# Patient Record
Sex: Male | Born: 2000 | Race: White | Hispanic: No | Marital: Single | Smoking: Never smoker
Health system: Southern US, Community
[De-identification: ages and names within clinical notes are randomized; demographics above are authoritative.]

## PROBLEM LIST (undated history)

## (undated) HISTORY — PX: OTHER SURGICAL HISTORY: SHX169

## (undated) HISTORY — PX: TONSILLECTOMY: SUR1361

---
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
## 2022-01-20 DIAGNOSIS — J0110 Acute frontal sinusitis, unspecified: Secondary | ICD-10-CM | POA: Diagnosis not present

## 2022-03-08 IMAGING — RF DG UGI W/ HIGH DENSITY W/O KUB
9 of 14 series · 14 of 23 positions shown · non-contrast
Comparison: Today's abdominal ultrasound, dictated separately.

CLINICAL DATA: Abdominal pain and nausea

EXAM:
UPPER GI SERIES WITH KUB
TECHNIQUE: After obtaining a scout radiograph a routine upper GI series was
performed using thin and high density barium.
FLUOROSCOPY TIME:  Fluoroscopy Time:  2 minutes and 24 seconds
Radiation Exposure Index (if provided by the fluoroscopic device):
24.9 mGy
Number of Acquired Spot Images: 0

[Series 1: t abdomen supine · 0.15mm/px · 1 of 1 slices shown]
[im 1/1]
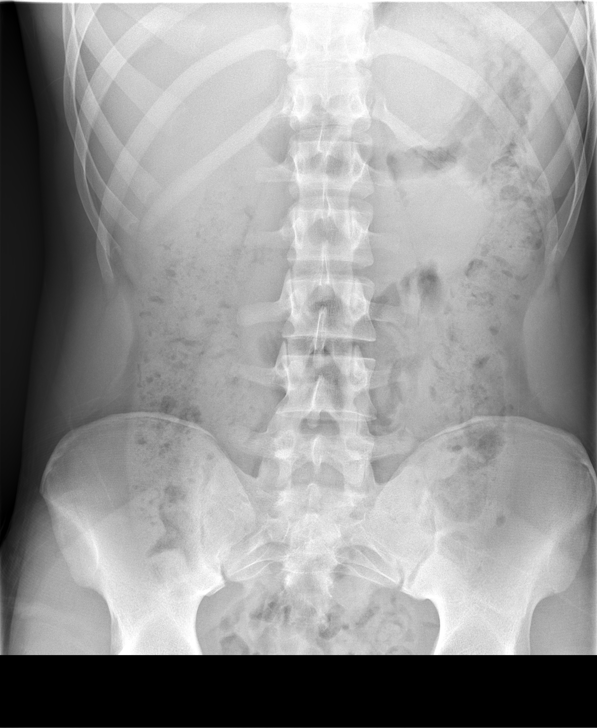

[Series 2: cp_standard · 0.51mm/px · 2 of 322 frames shown (1 of 8)]
[frame 49/322]
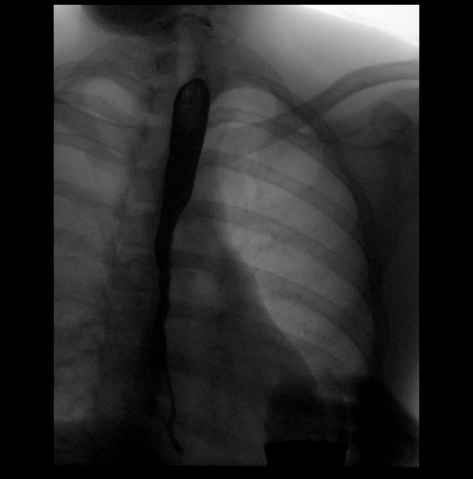
[frame 274/322]
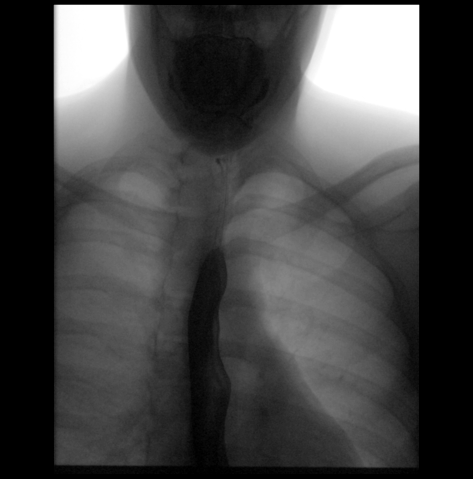

[Series 3: cp_standard · 0.26mm/px · 1 of 1 slices shown (2 of 8)]
[im 1/1]
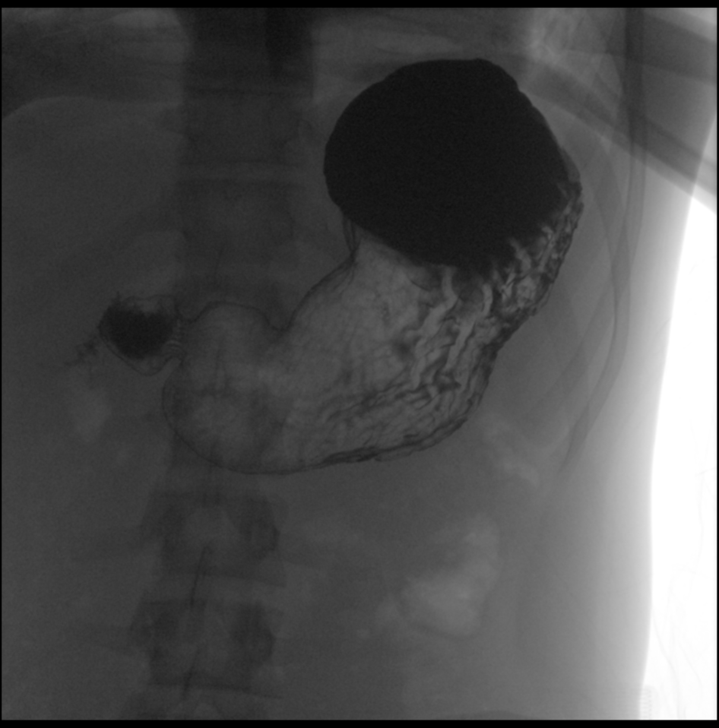

[Series 5: cp_standard · 0.25mm/px · 1 of 1 slices shown (3 of 8)]
[im 1/1]
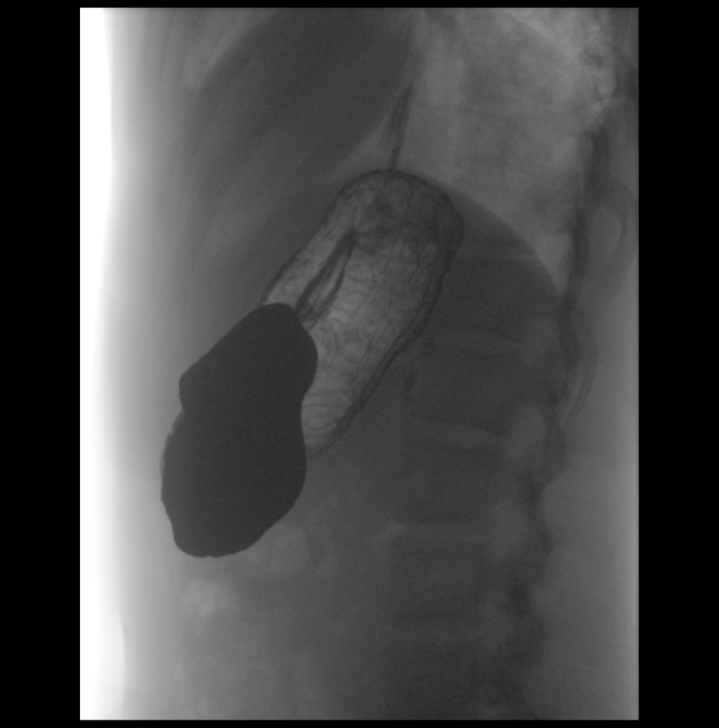

[Series 7: cp_standard · 0.25mm/px · 1 of 1 slices shown (4 of 8)]
[im 1/1]
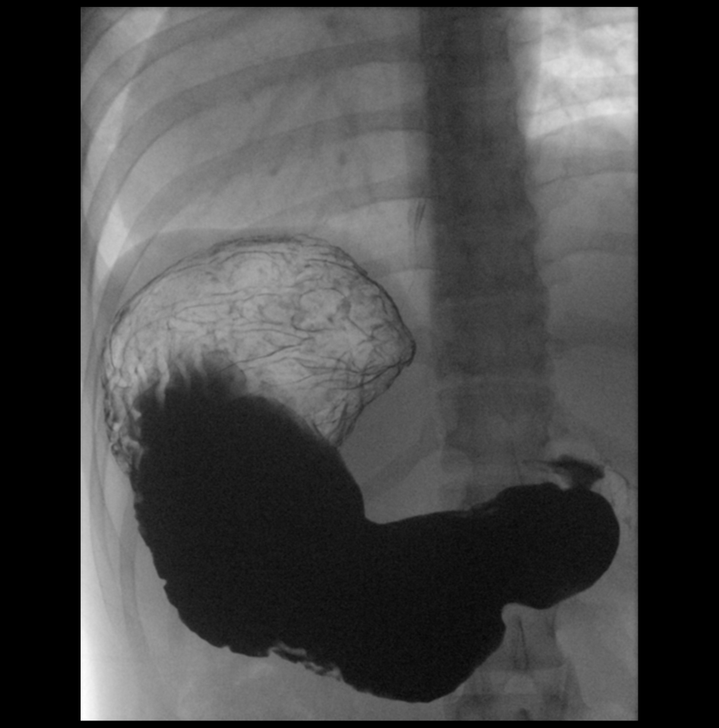

[Series 8: cp_standard · 0.26mm/px · 1 of 1 slices shown (5 of 8)]
[im 1/1]
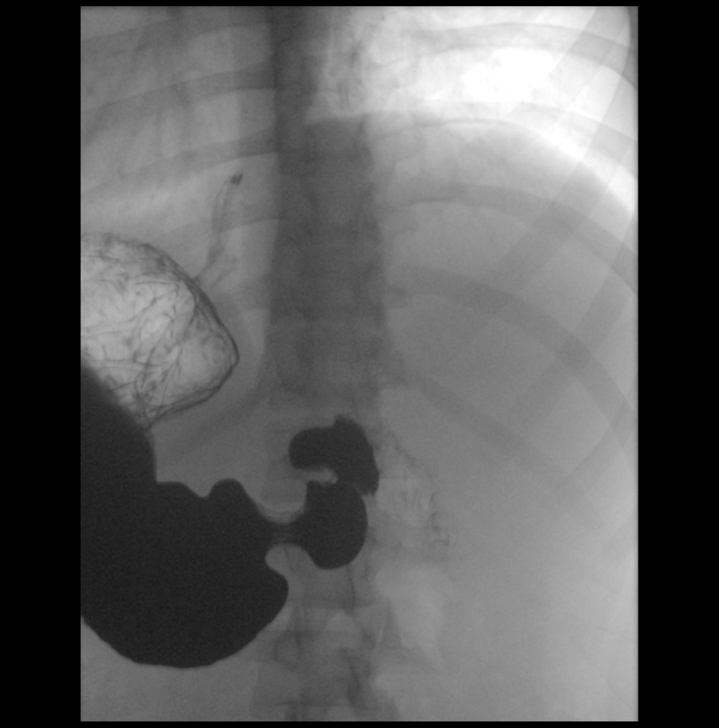

[Series 10: cp_standard · 0.53mm/px · 3 of 132 frames shown (6 of 8)]
[frame 20/132]
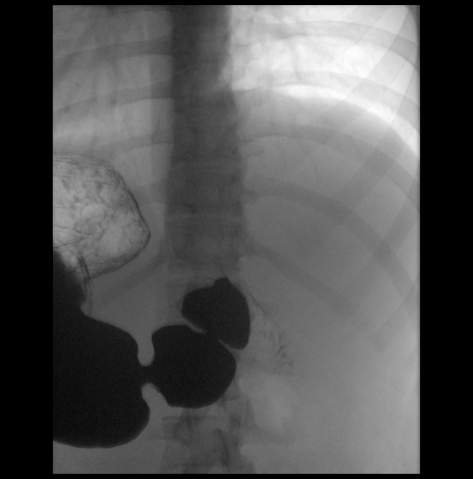
[frame 67/132]
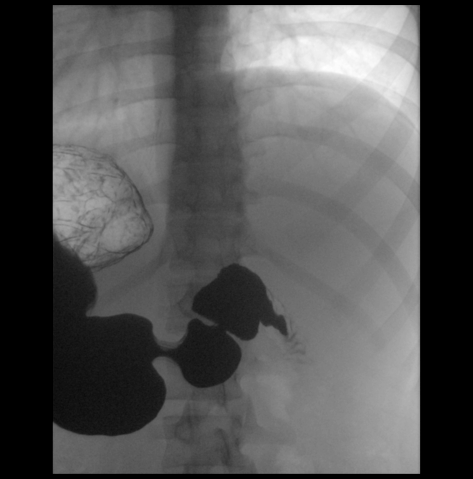
[frame 113/132]
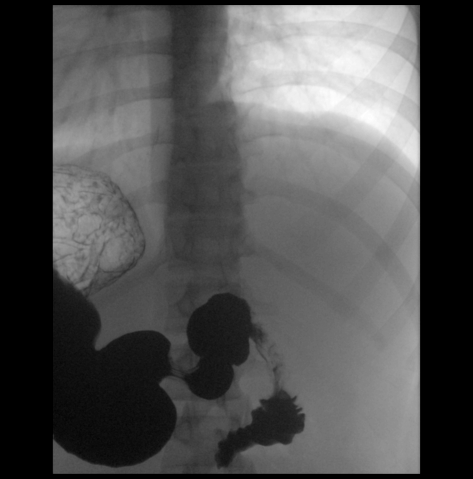

[Series 12: cp_standard · 0.52mm/px · 3 of 75 frames shown (7 of 8)]
[frame 10/75]
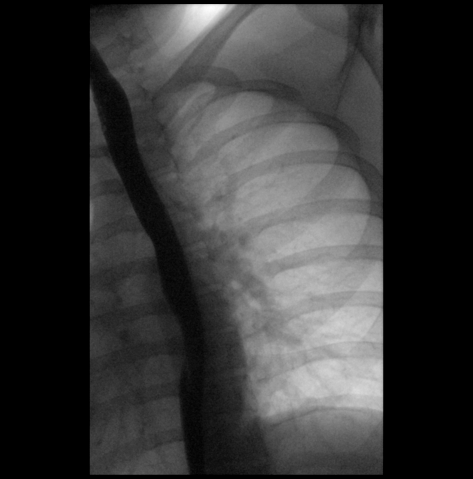
[frame 12/75]
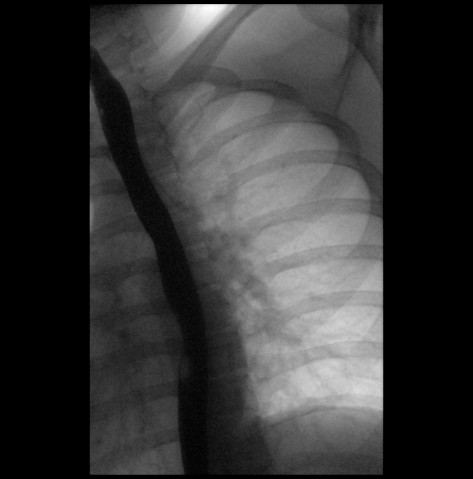
[frame 64/75]
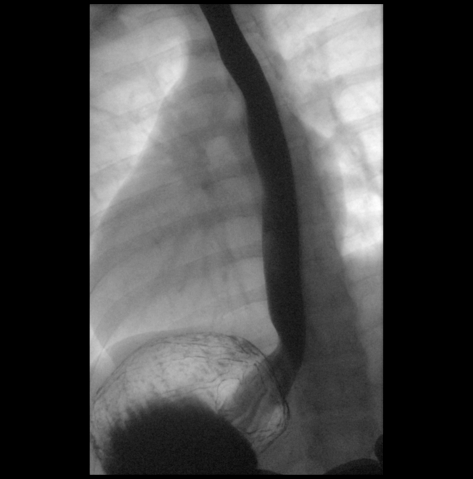

[Series 14: cp_standard · 0.26mm/px · 1 of 1 slices shown (8 of 8)]
[im 1/1]
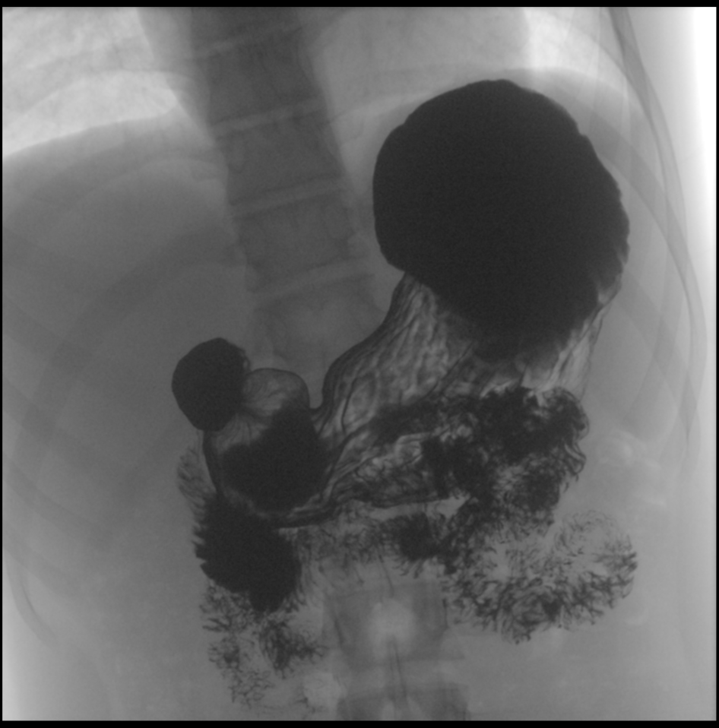

[14 of 23 positions shown; findings below may reference images not displayed]

FINDINGS: Preprocedure scout film demonstrates a nonobstructive bowel-gas
pattern. No abnormal abdominal calcifications. No appendicolith.

Double contrast evaluation of the esophagus demonstrates no mucosal
abnormality.

Double contrast evaluation of the stomach demonstrates no mass,
ulcer, fold abnormality. Prompt passage of contrast into the
duodenal bulb and C-loop. These are normal in appearance

Full column evaluation of the esophagus demonstrates no persistent
narrowing or stricture.
IMPRESSION: Normal upper GI.  No explanation for patient's symptoms.

## 2022-04-19 DIAGNOSIS — J0110 Acute frontal sinusitis, unspecified: Secondary | ICD-10-CM | POA: Diagnosis not present

## 2022-04-25 DIAGNOSIS — R5383 Other fatigue: Secondary | ICD-10-CM | POA: Diagnosis not present

## 2022-04-25 DIAGNOSIS — Z0000 Encounter for general adult medical examination without abnormal findings: Secondary | ICD-10-CM | POA: Diagnosis not present

## 2022-04-25 DIAGNOSIS — Z880 Allergy status to penicillin: Secondary | ICD-10-CM | POA: Diagnosis not present

## 2022-04-25 DIAGNOSIS — L299 Pruritus, unspecified: Secondary | ICD-10-CM | POA: Diagnosis not present

## 2022-06-01 DIAGNOSIS — R69 Illness, unspecified: Secondary | ICD-10-CM | POA: Diagnosis not present

## 2022-06-20 DIAGNOSIS — R69 Illness, unspecified: Secondary | ICD-10-CM | POA: Diagnosis not present

## 2022-06-28 ENCOUNTER — Other Ambulatory Visit: Payer: Self-pay | Admitting: Gastroenterology

## 2022-06-28 DIAGNOSIS — R112 Nausea with vomiting, unspecified: Secondary | ICD-10-CM

## 2022-07-04 DIAGNOSIS — R69 Illness, unspecified: Secondary | ICD-10-CM | POA: Diagnosis not present

## 2022-07-06 ENCOUNTER — Ambulatory Visit
Admission: RE | Admit: 2022-07-06 | Discharge: 2022-07-06 | Disposition: A | Discharge status: Home / Self Care | Payer: 59 | Source: Ambulatory Visit | Attending: Gastroenterology | Admitting: Gastroenterology

## 2022-07-06 DIAGNOSIS — R112 Nausea with vomiting, unspecified: Secondary | ICD-10-CM

## 2022-07-06 IMAGING — DX DG CHEST 2V
2 series · 2 of 2 positions shown · non-contrast
Comparison: None.

CLINICAL DATA: Cough.

EXAM:
CHEST - 2 VIEW

[chest pa]
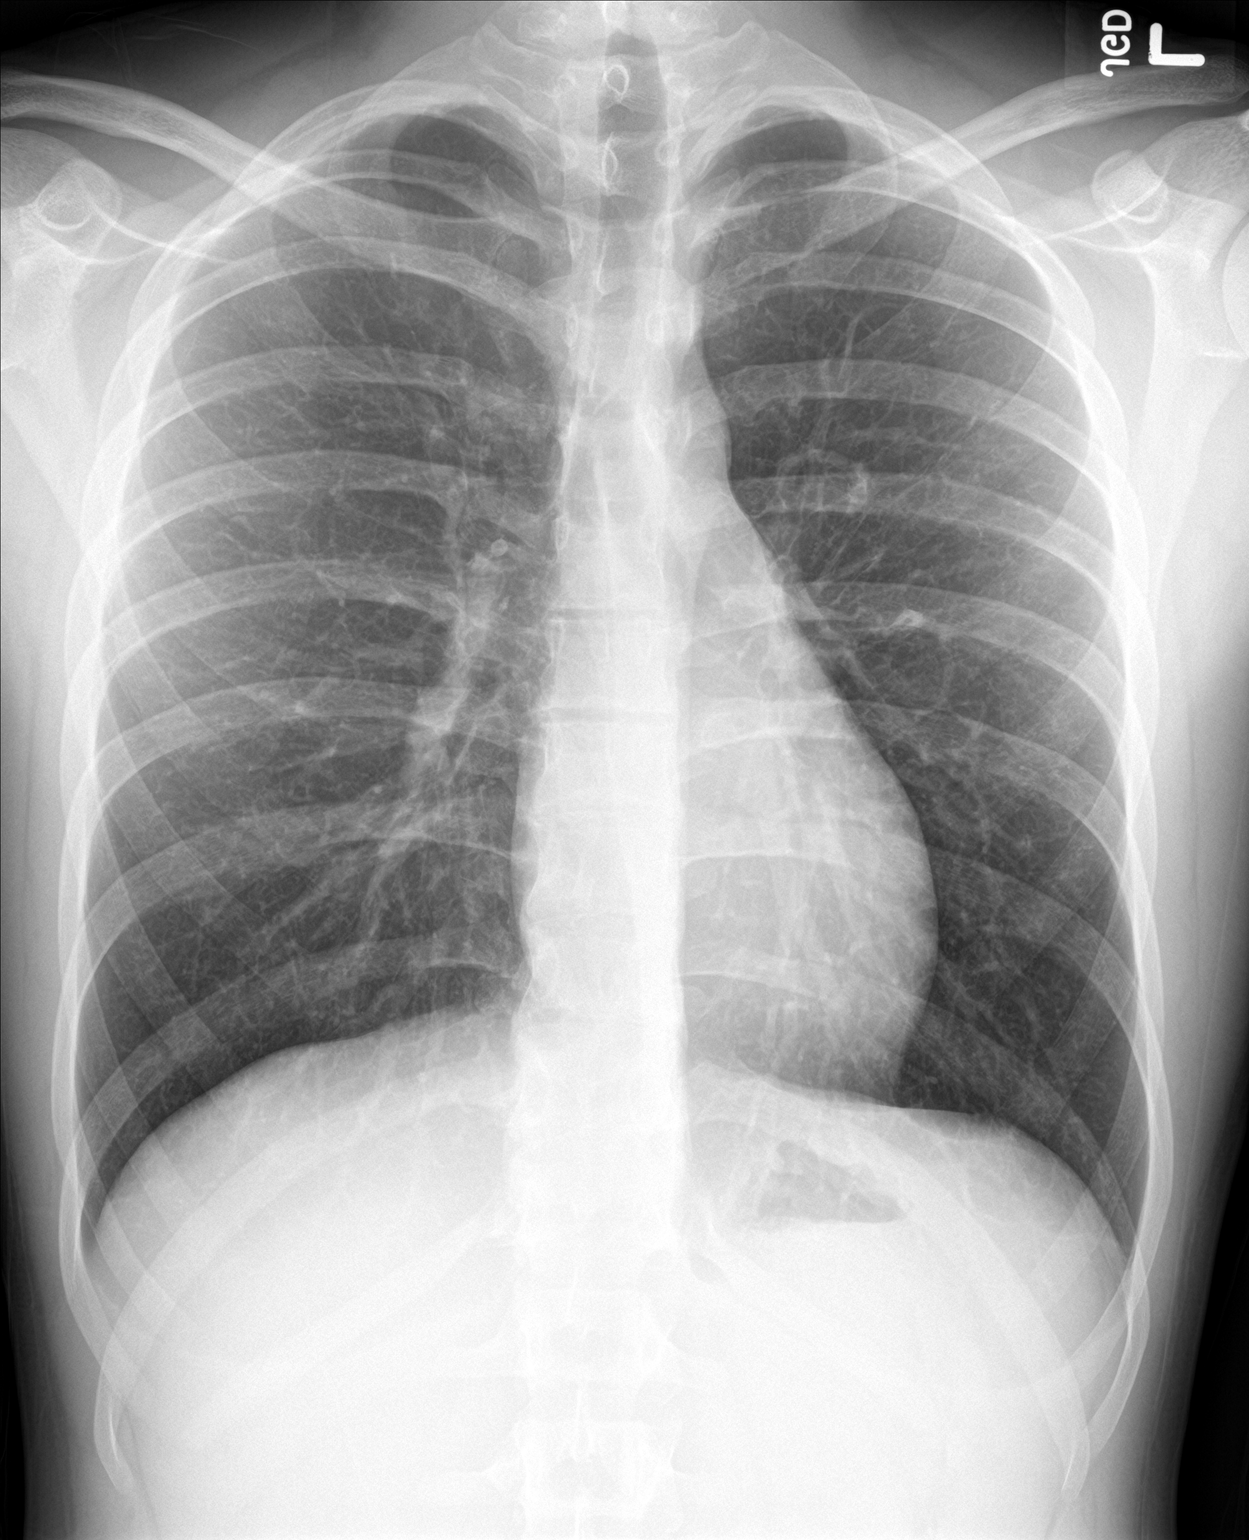

[chest lat]
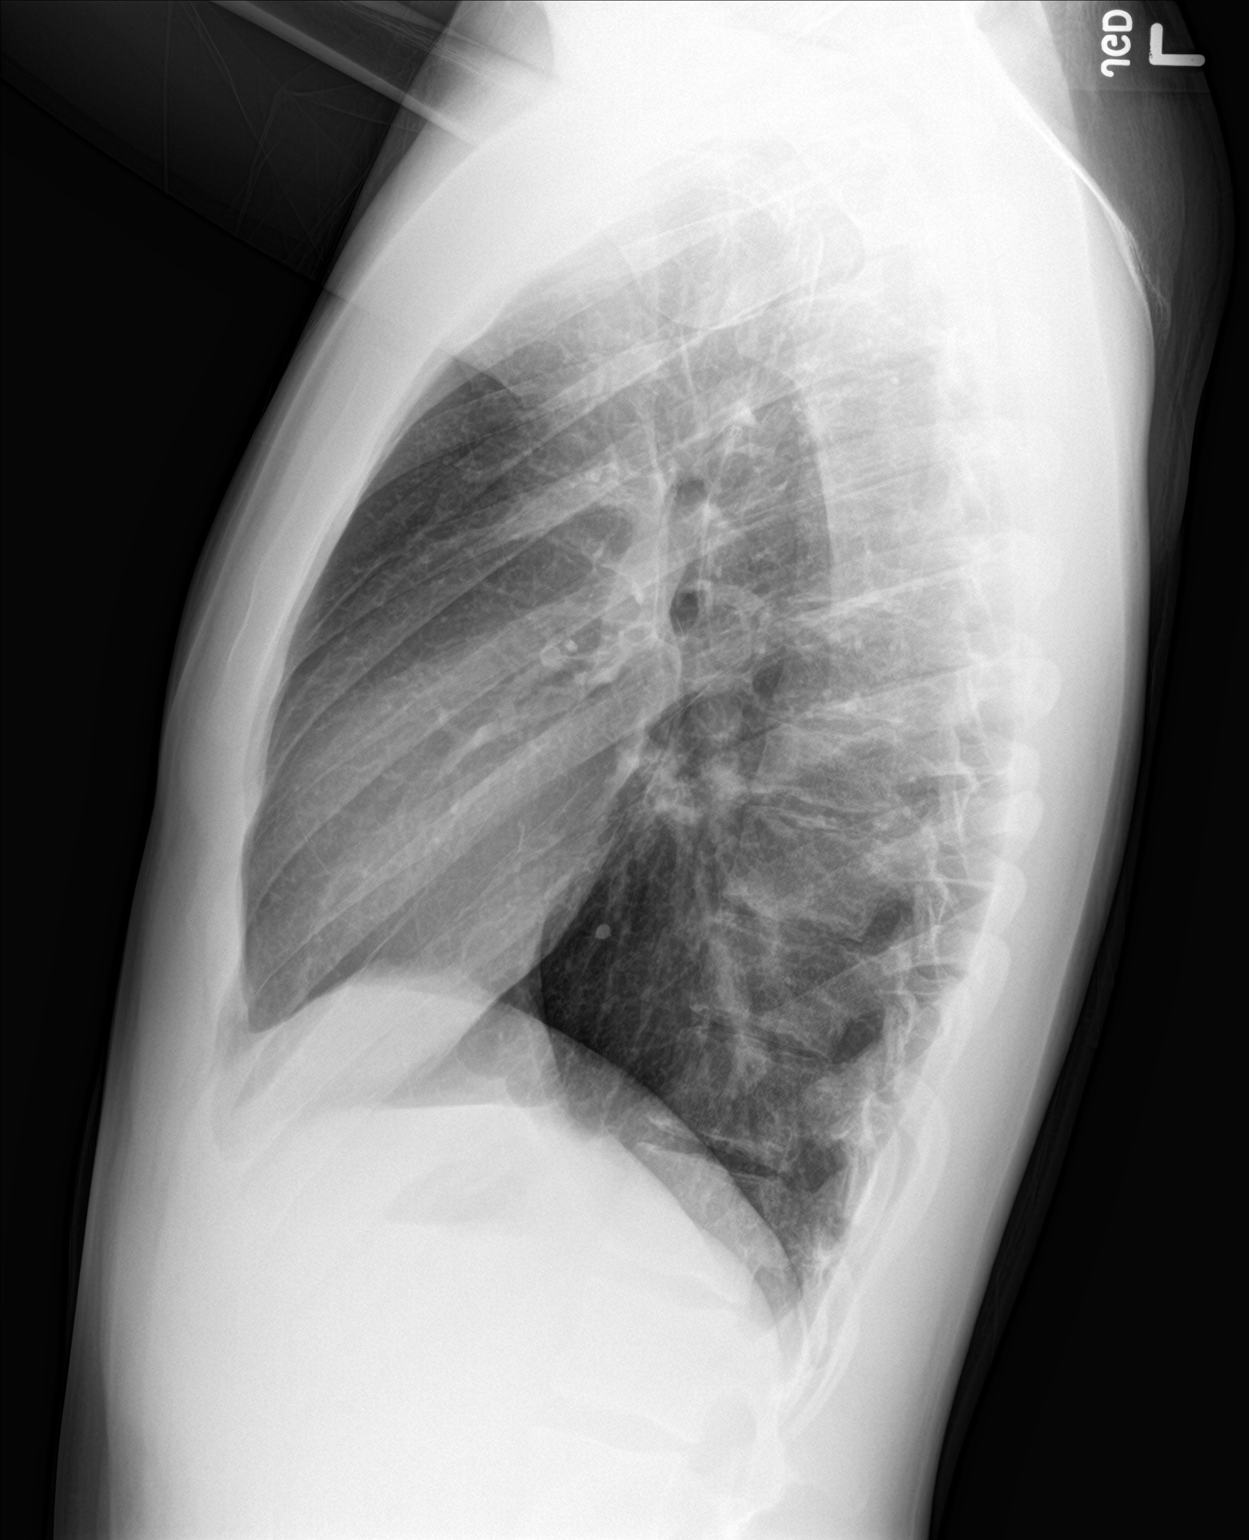

[2 of 2 positions shown; findings below may reference images not displayed]

FINDINGS: The heart size and mediastinal contours are within normal limits.
Both lungs are clear. The visualized skeletal structures are
unremarkable.
IMPRESSION: No active cardiopulmonary disease.

## 2022-07-11 DIAGNOSIS — R69 Illness, unspecified: Secondary | ICD-10-CM | POA: Diagnosis not present

## 2022-07-21 DIAGNOSIS — R69 Illness, unspecified: Secondary | ICD-10-CM | POA: Diagnosis not present

## 2022-08-18 DIAGNOSIS — R69 Illness, unspecified: Secondary | ICD-10-CM | POA: Diagnosis not present

## 2022-09-06 DIAGNOSIS — R69 Illness, unspecified: Secondary | ICD-10-CM | POA: Diagnosis not present

## 2022-09-19 DIAGNOSIS — R0981 Nasal congestion: Secondary | ICD-10-CM | POA: Diagnosis not present

## 2022-09-19 DIAGNOSIS — B353 Tinea pedis: Secondary | ICD-10-CM | POA: Diagnosis not present

## 2022-09-26 DIAGNOSIS — R69 Illness, unspecified: Secondary | ICD-10-CM | POA: Diagnosis not present

## 2022-10-24 DIAGNOSIS — R69 Illness, unspecified: Secondary | ICD-10-CM | POA: Diagnosis not present

## 2022-10-30 DIAGNOSIS — R69 Illness, unspecified: Secondary | ICD-10-CM | POA: Diagnosis not present

## 2022-11-27 ENCOUNTER — Telehealth: Payer: Self-pay | Admitting: Internal Medicine

## 2022-11-27 DIAGNOSIS — R69 Illness, unspecified: Secondary | ICD-10-CM | POA: Diagnosis not present

## 2022-11-27 NOTE — Telephone Encounter (Signed)
Good Morning Dr Leonides Schanz  Patient is requesting transfer of care to Dr Marina Goodell. Per moms request. Please advise.  Thank you

## 2022-12-01 NOTE — Telephone Encounter (Signed)
I am unable to accommodate. Sorry. The patient is in excellent hands with Dr. Leonides Schanz. Thanks

## 2022-12-12 NOTE — Telephone Encounter (Signed)
Patient's mother called asking for a second opinion and requested to see Dr. Carlean Purl. Would you approve request?

## 2022-12-18 NOTE — Telephone Encounter (Signed)
I am not able to fulfill this request, either.

## 2022-12-19 DIAGNOSIS — R69 Illness, unspecified: Secondary | ICD-10-CM | POA: Diagnosis not present

## 2022-12-20 NOTE — Telephone Encounter (Signed)
Spoke with patients mother and advised her of information below. She understood but said she will be calling back to ask another provider.

## 2023-01-23 ENCOUNTER — Ambulatory Visit: Payer: Self-pay | Admitting: Internal Medicine

## 2023-01-31 ENCOUNTER — Encounter: Payer: Self-pay | Admitting: Internal Medicine

## 2023-02-02 ENCOUNTER — Ambulatory Visit: Payer: Self-pay | Admitting: Internal Medicine

## 2023-02-28 ENCOUNTER — Encounter: Payer: Self-pay | Admitting: Internal Medicine

## 2023-02-28 ENCOUNTER — Ambulatory Visit: Payer: Self-pay | Admitting: Internal Medicine

## 2023-02-28 VITALS — BP 110/58 | HR 56 | Ht 72.0 in | Wt 170.0 lb

## 2023-02-28 DIAGNOSIS — R1033 Periumbilical pain: Secondary | ICD-10-CM

## 2023-02-28 DIAGNOSIS — R5383 Other fatigue: Secondary | ICD-10-CM

## 2023-02-28 DIAGNOSIS — R112 Nausea with vomiting, unspecified: Secondary | ICD-10-CM

## 2023-02-28 DIAGNOSIS — R634 Abnormal weight loss: Secondary | ICD-10-CM

## 2023-02-28 MED ORDER — DICYCLOMINE HCL 10 MG PO CAPS: 10.0000 mg | ORAL_CAPSULE | Freq: Three times a day (TID) | ORAL | 0 refills | Status: DC | PRN

## 2023-02-28 NOTE — Progress Notes (Signed)
Chief Complaint: Abdominal pain, N&V  HPI : 22 year old male without significant medical history presents for follow up of abdominal pain and N&V  Interval History: His N&V and ab pain have persisted for years.  Every time he is sick, he has a stomach ache. The pain is around the belly button. When he wakes up, he will have nausea. Rarely he will throw up.  Eating a lot carbs and fried foods tonight before hand seems to make his symptoms worse. Denies chest burning or regurgitation. Endorses diarrhea twice a month. Weight has been going down unintentionally. He has used marijuana and Delta-8 1-2 months ago but not recently. Denies alcohol use. He does feel like he is more predisposed to getting infections. Pepto Bismol helps with his N&V and abdominal pain. 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 Since her last clinic visit, patient was seen by a naturopathic provider who performed barium integrity testing that suggested a high zonulin level, which he was told is concerning for celiac disease.  He tried the omeprazole medication that I prescribed to him previously, but he only took this medication sporadically so he states it is not clear whether or not this medication helped him.  Wt Readings from Last 3 Encounters:  02/28/23 170 lb (77.1 kg) (72 %, Z= 0.58)*  10/22/21 159 lb 6.4 oz (72.3 kg) (66 %, Z= 0.41)*  09/14/21 171 lb 4 oz (77.7 kg) (79 %, Z= 0.82)*   * Growth percentiles are based on CDC (Boys, 2-20 Years) data.    Current Outpatient Medications  Medication Sig Dispense Refill   bismuth subsalicylate (PEPTO BISMOL) 262 MG chewable tablet Chew 524 mg by mouth as needed.     dicyclomine (BENTYL) 10 MG capsule Take 1 capsule (10 mg total) by mouth 3 (three) times daily as needed for spasms. 90 capsule 0   omeprazole (PRILOSEC) 40 MG capsule Take 1 capsule (40 mg total) by mouth daily. Take one capsule 30 minutes before dinner daily. (Patient not taking: Reported on  02/28/2023) 30 capsule 1   ondansetron (ZOFRAN) 4 MG tablet Take 1 tablet (4 mg total) by mouth 3 (three) times daily as needed for nausea or vomiting. (Patient not taking: Reported on 02/28/2023) 30 tablet 1   ondansetron (ZOFRAN-ODT) 8 MG disintegrating tablet Take 1 tablet (8 mg total) by mouth every 8 (eight) hours as needed for nausea or vomiting. (Patient not taking: Reported on 02/28/2023) 20 tablet 0   No current facility-administered medications for this visit.   Physical Exam: BP (!) 110/58 (BP Location: Left Arm, Patient Position: Sitting, Cuff Size: Normal)   Pulse (!) 56   Ht 6' (1.829 m)   Wt 170 lb (77.1 kg)   BMI 23.06 kg/m  Constitutional: Pleasant,well-developed, male in no acute distress. HEENT: Normocephalic and atraumatic. Conjunctivae are normal. No scleral icterus. Cardiovascular: Normal rate, regular rhythm.  Pulmonary/chest: Effort normal and breath sounds normal. No wheezing, rales or rhonchi. Abdominal: Soft, nondistended, mildly tender to palpation in the epigastric area and in the lower abdomen. Bowel sounds active throughout. There are no masses palpable. No hepatomegaly. Extremities: No edema Neurological: Alert and oriented to person place and time. Skin: Skin is warm and dry. No rashes noted. Psychiatric: Normal mood and affect. Behavior is normal.  Labs 09/2021: CBC and CMP unremarkable. Lipase mildly elevated at 81. TSH nml. TTG IgA negative.  Cortisol nml.   CT A/P 03/31/12: IMPRESSION:  Negative for appendicitis.  Findings consistent with mesenteric  adenitis.   Upper GI series 06/24/21: IMPRESSION: Normal upper GI.  No explanation for patient's symptoms.  Ab U/S 06/24/21: IMPRESSION: Normal abdominal ultrasound for age.  Ab U/S 07/06/22: IMPRESSION: 1. No abnormalities are identified to explain the patient's nausea and vomiting. Normal study within visualized limits.  ASSESSMENT AND PLAN: Periumbilical abdominal pain N&V Fatigue Weight  loss Patient continues to have issues with nausea, vomiting, and abdominal pain.  Most of the symptoms primarily occur in the mornings, which may suggest a component of GERD.  Patient does have a sister with history of celiac disease so we will plan for an EGD for further evaluation.  Patient also describes some issues with fatigue, chronic recurrent infections, and weight loss so we will plan for a lab evaluation of vitamin deficiencies and look for signs of low immunoglobulin levels.  Patient reports previously noted to have a mildly elevated lipase levels, which were not high enough to qualify for a diagnosis of pancreatitis.  Will look for signs of underlying pancreas inflammation by performing a CT scan and rechecking his lipase level today.  In the meantime we will have him follow a low FODMAP diet and use Bentyl as needed for abdominal pain issues. - Low FODMAP diet - Check vitamin D, vitamin B12, folate, ferritin/IBC, CRP, IgG, lipase - Bentyl 10 mg TID PRN for ab pain - Avoid marijuana and delta 8 use, which can cause worsened nausea, vomiting and abdominal pain - CT A/P w/contrast - EGD [facility] - Consider GES in the future  Christia Reading, MD  I spent 41 minutes of time, including in depth chart review, independent review of results as outlined above, communicating results with the patient directly, face-to-face time with the patient, coordinating care, ordering studies and medications as appropriate, and documentation.

## 2023-02-28 NOTE — Patient Instructions (Addendum)
You have been scheduled for a CT scan of the abdomen and pelvis at [facility], 1st floor Radiology. You are scheduled on 03/09/23 at 3:30 pm. You should arrive 15 minutes prior to your appointment time for registration.   You may take any medications as prescribed with a small amount of water, if necessary. If you take any of the following medications: METFORMIN, GLUCOPHAGE, GLUCOVANCE, AVANDAMET, RIOMET, FORTAMET, [place] MET, JANUMET, GLUMETZA or METAGLIP, you MAY be asked to HOLD this medication 48 hours AFTER the exam.   The purpose of you drinking the oral contrast is to aid in the visualization of your intestinal tract. The contrast solution may cause some diarrhea. Depending on your individual set of symptoms, you may also receive an intravenous injection of x-ray contrast/dye. Plan on being at [facility] for 45 minutes or longer, depending on the type of exam you are having performed.   If you have any questions regarding your exam or if you need to reschedule, you may call [facility] at [phone] between the hours of 8:00 am and 5:00 pm, Monday-Friday.   You have been scheduled for an endoscopy. Please follow written instructions given to you at your visit today. If you use inhalers (even only as needed), please bring them with you on the day of your procedure.   We have sent the following medications to your pharmacy for you to pick up at your convenience: Bentyl  _______________________________________________________  If your blood pressure at your visit was 140/90 or greater, please contact your primary care physician to follow up on this.  _______________________________________________________  If you are age 43 or older, your body mass index should be between 23-30. Your Body mass index is 23.06 kg/m. If this is out of the aforementioned range listed, please consider follow up with your Primary Care Provider.  If you are age 80 or younger, your body mass  index should be between 19-25. Your Body mass index is 23.06 kg/m. If this is out of the aformentioned range listed, please consider follow up with your Primary Care Provider.   ________________________________________________________  The [facility] providers would like to encourage you to use [facility] to communicate with providers for non-urgent requests or questions.  Due to long hold times on the telephone, sending your provider a message by [facility] may be a faster and more efficient way to get a response.  Please allow 48 business hours for a response.  Please remember that this is for non-urgent requests.  _______________________________________________________   Due to recent changes in healthcare laws, you may see the results of your imaging and laboratory studies on MyChart before your provider has had a chance to review them.  We understand that in some cases there may be results that are confusing or concerning to you. Not all laboratory results come back in the same time frame and the provider may be waiting for multiple results in order to interpret others.  Please give [place] 48 hours in order for your provider to thoroughly review all the results before contacting the office for clarification of your results.    Thank you for entrusting me with your care and for choosing [facility], Dr. Christia Reading

## 2023-03-08 DIAGNOSIS — F411 Generalized anxiety disorder: Secondary | ICD-10-CM | POA: Diagnosis not present

## 2023-03-09 ENCOUNTER — Ambulatory Visit (HOSPITAL_COMMUNITY): Payer: Self-pay

## 2023-03-14 DIAGNOSIS — F411 Generalized anxiety disorder: Secondary | ICD-10-CM | POA: Diagnosis not present

## 2023-03-15 ENCOUNTER — Telehealth: Payer: Self-pay

## 2023-03-15 NOTE — Telephone Encounter (Signed)
Spoke with the patient's mother. [employer] insurance denied the CT A/P to be done in a hospital setting. The imaging must be done in an outpatient setting. Advised she will receive a call from [facility] to schedule the same imaging that was ordered, but in a different location. Expresses understanding. Asked if she had questions. No questions.

## 2023-03-23 ENCOUNTER — Ambulatory Visit (HOSPITAL_COMMUNITY): Payer: 59

## 2023-03-29 ENCOUNTER — Ambulatory Visit: Payer: 59 | Admitting: Internal Medicine

## 2023-03-29 DIAGNOSIS — F411 Generalized anxiety disorder: Secondary | ICD-10-CM | POA: Diagnosis not present

## 2023-04-20 ENCOUNTER — Other Ambulatory Visit: Payer: 59

## 2023-04-24 ENCOUNTER — Encounter: Payer: Self-pay | Admitting: Internal Medicine

## 2023-04-26 DIAGNOSIS — Z0000 Encounter for general adult medical examination without abnormal findings: Secondary | ICD-10-CM | POA: Diagnosis not present

## 2023-04-27 ENCOUNTER — Encounter: Payer: Self-pay | Admitting: Internal Medicine

## 2023-04-27 ENCOUNTER — Ambulatory Visit: Payer: Self-pay | Admitting: Internal Medicine

## 2023-04-27 ENCOUNTER — Telehealth: Payer: Self-pay | Admitting: Internal Medicine

## 2023-04-27 VITALS — BP 141/86 | HR 54 | Temp 98.1°F | Ht 72.0 in | Wt 170.0 lb

## 2023-04-27 MED ORDER — SODIUM CHLORIDE 0.9 % IV SOLN: 500.0000 mL | Freq: Once | INTRAVENOUS | Status: DC

## 2023-04-27 NOTE — Telephone Encounter (Signed)
After talking with Kenneth Mainland, CRNA she said it would be fine to continue with his procedure today. Given that she will not be giving gases and just propofol it should be a problem at all.

## 2023-04-27 NOTE — Progress Notes (Signed)
Pt drank at 3pm. Would not be able to start procedure until 5pm. Procedure rescheduled for Monday 6/3/224 at 10am.

## 2023-04-27 NOTE — Telephone Encounter (Signed)
Patients father called stating that his uncle has malignant hypothermia. He is concerned for the anesthesia for patient's endoscopy for today and wanted to make sure that this was on his chart. Please advise, thank you.

## 2023-05-01 DIAGNOSIS — R29898 Other symptoms and signs involving the musculoskeletal system: Secondary | ICD-10-CM | POA: Diagnosis not present

## 2023-05-01 DIAGNOSIS — F439 Reaction to severe stress, unspecified: Secondary | ICD-10-CM | POA: Diagnosis not present

## 2023-05-01 DIAGNOSIS — Z0000 Encounter for general adult medical examination without abnormal findings: Secondary | ICD-10-CM | POA: Diagnosis not present

## 2023-05-08 ENCOUNTER — Ambulatory Visit: Payer: 59 | Admitting: Family Medicine

## 2023-05-08 ENCOUNTER — Ambulatory Visit (INDEPENDENT_AMBULATORY_CARE_PROVIDER_SITE_OTHER): Payer: 59

## 2023-05-08 ENCOUNTER — Encounter: Payer: Self-pay | Admitting: Family Medicine

## 2023-05-08 ENCOUNTER — Other Ambulatory Visit: Payer: Self-pay

## 2023-05-08 VITALS — BP 102/72 | HR 50 | Ht 73.0 in | Wt 173.6 lb

## 2023-05-08 DIAGNOSIS — G8929 Other chronic pain: Secondary | ICD-10-CM

## 2023-05-08 DIAGNOSIS — M25562 Pain in left knee: Secondary | ICD-10-CM | POA: Diagnosis not present

## 2023-05-08 DIAGNOSIS — M25561 Pain in right knee: Secondary | ICD-10-CM

## 2023-05-08 DIAGNOSIS — F439 Reaction to severe stress, unspecified: Secondary | ICD-10-CM | POA: Insufficient documentation

## 2023-05-08 MED ORDER — NITROGLYCERIN 0.2 MG/HR TD PT24: MEDICATED_PATCH | TRANSDERMAL | 1 refills | Status: DC

## 2023-05-08 NOTE — Progress Notes (Signed)
I, [name], CMA acting as a scribe for [name], MD.  Kenneth Wilkinson is a 22 y.o. male who presents to [facility] today for bilat knee pain, R>L. Pain x 2.5 years, denies past or current injury. Sx present distal to patella. Sx come and go, worse while running or exercising. Mechanical sx present. Denies swelling. Occasional numbness at the knees. Has tried stretching, feels general tightness in the legs, especially the hamstring. No other treatment modalities. No recent imaging.   He did see either an Event organiser or physician or provider in high school for similar pain and was recommended to use patellar straps.  This helped a little.  Pertinent review of systems: No fevers or chills  Relevant historical information: Otherwise healthy.  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   Exam:  BP 102/72   Pulse (!) 50   Ht 6\' 1"  (1.854 m)   Wt 173 lb 9.6 oz (78.7 kg)   SpO2 97%   BMI 22.90 kg/m  General: Well Developed, well nourished, and in no acute distress.   MSK: Knees bilaterally normal appearing. Normal motion. Tender palpation proximal patellar tendons bilateral knees. Stable ligamentous exam. Intact strength.    Lab and Radiology Results  Diagnostic Limited MSK Ultrasound of: Bilateral knees Right knee: Intact normal-appearing quad tendon. Patellar tendon is thick near origin inferior patellar pole without tendon disruption.  Mild increased vascular activity in the peritendinous region consistent with peritendinitis. Normal medial lateral joint line. No Baker's cyst. Left knee: Intact normal-appearing quad tendon. Patellar tendon is also thick near origin inferior patellar pole.  There is a small hypoechoic change of the deep proximal patellar tendon. Increased vascular activity. Otherwise patellar tendon is normal-appearing Medial and lateral joint line are normal-appearing. No Baker's  cyst. Impression: Bilateral proximal patellar tendinitis left worse than right.  X-ray images bilateral knees obtained today personally interpreted.  Right knee: Normal-appearing knee.  No significant DJD or calcific tendinitis.  No acute fractures are visible.  Left knee: No significant bony abnormality.  No DJD.  No fracture.  No calcific tendinitis visible.  Await formal radiology review   Assessment and Plan: 22 y.o. male with bilateral anterior knee pain.  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  He is a good candidate for trial of PT.  Plan to refer to PT and use nitroglycerin patch protocol.  Recheck in 6 weeks.   PDMP not reviewed this encounter. Orders Placed This Encounter  Procedures   [place] LIMITED JOINT SPACE STRUCTURES LOW BILAT(NO LINKED CHARGES)    Order Specific Question:   Reason for Exam (SYMPTOM  OR DIAGNOSIS REQUIRED)    Answer:   bilat knee pain, distal to patella    Order Specific Question:   Preferred imaging location?    Answer:   [facility] Knee AP/LAT W/Sunrise Right    Standing Status:   Future    Number of Occurrences:   1    Standing Expiration Date:   06/08/2023    Order Specific Question:   Reason for Exam (SYMPTOM  OR DIAGNOSIS REQUIRED)    Answer:   bilat knee pain, distal to patella    Order Specific Question:   Preferred imaging location?    Answer:   [name] Knee AP/LAT W/Sunrise Left  Standing Status:   Future    Number of Occurrences:   1    Standing Expiration Date:   06/08/2023    Order Specific Question:   Reason for Exam (SYMPTOM  OR DIAGNOSIS REQUIRED)    Answer:   bilat knee pain, distal to patella    Order Specific Question:   Preferred imaging location?    Answer:   [name]   Ambulatory referral to Physical Therapy    Referral Priority:   Routine    Referral  Type:   Physical Medicine    Referral Reason:   Specialty Services Required    Requested Specialty:   Physical Therapy    Number of Visits Requested:   1   Meds ordered this encounter  Medications   nitroGLYCERIN (NITRODUR - DOSED IN MG/24 HR) 0.2 mg/hr patch    Sig: Apply 1/4 patch daily to tendon for tendonitis.    Dispense:  30 patch    Refill:  1     Discussed warning signs or symptoms. Please see discharge instructions. Patient expresses understanding.   The above documentation has been reviewed and is accurate and complete [name], M.D.

## 2023-05-08 NOTE — Progress Notes (Signed)
Left knee x-ray looks normal to radiology

## 2023-05-08 NOTE — Patient Instructions (Signed)
Thank you for coming in today.   Please get an Xray today before you leave   I've referred you to Physical Therapy.  Let us know if you don't hear from them in one week.   Nitroglycerin Protocol  Apply 1/4 nitroglycerin patch to affected area daily. Change position of patch within the affected area every 24 hours. You may experience a headache during the first 1-2 weeks of using the patch, these should subside. If you experience headaches after beginning nitroglycerin patch treatment, you may take your preferred over the counter pain reliever. Another side effect of the nitroglycerin patch is skin irritation or rash related to patch adhesive. Please notify our office if you develop more severe headaches or rash, and stop the patch. Tendon healing with nitroglycerin patch may require 12 to 24 weeks depending on the extent of injury. Men should not use if taking Viagra, Cialis, or Levitra.  Do not use if you have migraines or rosacea.     Recheck in 6 weeks.   Let me know if this is not working

## 2023-05-08 NOTE — Progress Notes (Signed)
Right knee x-ray looks normal to radiology

## 2023-05-14 ENCOUNTER — Encounter: Payer: Self-pay | Admitting: Internal Medicine

## 2023-05-14 ENCOUNTER — Ambulatory Visit (AMBULATORY_SURGERY_CENTER): Payer: 59 | Admitting: Internal Medicine

## 2023-05-14 VITALS — BP 105/46 | HR 63 | Temp 99.1°F | Resp 11 | Ht 72.0 in | Wt 170.0 lb

## 2023-05-14 DIAGNOSIS — K2980 Duodenitis without bleeding: Secondary | ICD-10-CM | POA: Diagnosis not present

## 2023-05-14 DIAGNOSIS — K2990 Gastroduodenitis, unspecified, without bleeding: Secondary | ICD-10-CM | POA: Diagnosis not present

## 2023-05-14 DIAGNOSIS — K2970 Gastritis, unspecified, without bleeding: Secondary | ICD-10-CM | POA: Diagnosis not present

## 2023-05-14 DIAGNOSIS — R112 Nausea with vomiting, unspecified: Secondary | ICD-10-CM | POA: Diagnosis not present

## 2023-05-14 DIAGNOSIS — K319 Disease of stomach and duodenum, unspecified: Secondary | ICD-10-CM | POA: Diagnosis not present

## 2023-05-14 MED ORDER — OMEPRAZOLE 40 MG PO CPDR
40.0000 mg | DELAYED_RELEASE_CAPSULE | Freq: Two times a day (BID) | ORAL | 1 refills | Status: DC
Start: 2023-05-14 — End: 2023-06-18

## 2023-05-14 MED ORDER — SODIUM CHLORIDE 0.9 % IV SOLN
500.0000 mL | Freq: Once | INTRAVENOUS | Status: DC
Start: 2023-05-14 — End: 2023-05-14

## 2023-05-14 NOTE — Op Note (Signed)
[facility] Patient Name: Kenneth Wilkinson Procedure Date: 05/14/2023 10:20 AM MRN: [id number] Endoscopist: Particia Lather , , [id number] Age: 22 Referring MD:  Date of Birth: [birth year] Gender: Male Account #: [id number] Procedure:                Upper GI endoscopy Indications:              Periumbilical abdominal pain, Nausea with vomiting,                            Weight loss Medicines:                Monitored Anesthesia Care Procedure:                After obtaining informed consent, the endoscope was                            passed under direct vision. Throughout the                            procedure, the patient's blood pressure, pulse, and                            oxygen saturations were monitored continuously. The                            Olympus Scope [id number] was introduced through the                            mouth, and advanced to the second part of duodenum.                            The upper GI endoscopy was accomplished without                            difficulty. The patient tolerated the procedure                            well. Scope In: Scope Out: Findings:                 The examined esophagus was normal. Biopsies were                            taken with a cold forceps for histology.                           Clear fluid was found in the gastric body.                           Localized mild inflammation characterized by                            congestion (edema), erosions and erythema was found                            in the  gastric antrum. Biopsies were taken with a                            cold forceps for histology.                           Localized mild inflammation characterized by                            congestion (edema) and erythema was found in the                            duodenal bulb. Biopsies were taken with a cold                            forceps for histology. Complications:            No  immediate complications. Estimated Blood Loss:     Estimated blood loss was minimal. Impression:               - Normal esophagus. Biopsied.                           - Clear gastric fluid.                           - Gastritis. Biopsied.                           - Duodenitis. Biopsied. Recommendation:           - Discharge patient to home (with escort).                           - Await pathology results.                           - The findings and recommendations were discussed                            with the patient.                           - Use Prilosec (omeprazole) 40 mg PO BID for 8                            weeks.                           - Return to GI clinic in 6 weeks. Dr Particia Lather "Biggers" [place],  05/14/2023 10:42:24 AM

## 2023-05-14 NOTE — Progress Notes (Signed)
   GASTROENTEROLOGY PROCEDURE H&P NOTE   Primary Care Physician: Merri Brunette, MD    Reason for Procedure:   Periumbilical abdominal pain, N&V, weight loss  Plan:    EGD  Patient is appropriate for endoscopic procedure(s) in the ambulatory ([facility]) setting.  The nature of the procedure, as well as the risks, benefits, and alternatives were carefully and thoroughly reviewed with the patient. Ample time for discussion and questions allowed. The patient understood, was satisfied, and agreed to proceed.     HPI: Kenneth Wilkinson is a 22 y.o. male who presents for EGD for evaluation of periumbilical ab pain, N&V, weight loss.  Patient was most recently seen in the Gastroenterology Clinic on 02/28/23.  No interval change in medical history since that appointment. Please refer to that note for full details regarding GI history and clinical presentation.   History reviewed. No pertinent past medical history.  Past Surgical History:  Procedure Laterality Date   TONSILLECTOMY     wisdom teeth removal      Prior to Admission medications   Medication Sig Start Date End Date Taking? Authorizing Provider  Multiple Vitamin (MULTIVITAMIN PO) Take by mouth.    [provider]  nitroGLYCERIN (NITRODUR - DOSED IN MG/24 HR) 0.2 mg/hr patch Apply 1/4 patch daily to tendon for tendonitis. 05/08/23   Rodolph Bong, MD    Current Outpatient Medications  Medication Sig Dispense Refill   Multiple Vitamin (MULTIVITAMIN PO) Take by mouth.     nitroGLYCERIN (NITRODUR - DOSED IN MG/24 HR) 0.2 mg/hr patch Apply 1/4 patch daily to tendon for tendonitis. 30 patch 1   No current facility-administered medications for this visit.    Allergies as of 05/14/2023 - Review Complete 05/14/2023  Allergen Reaction Noted   Penicillins Rash 03/30/2012    No family history on file.  Social History   Socioeconomic History   Marital status: Single    Spouse name: Not on file   Number of children: 0    Years of education: Not on file   Highest education level: Not on file  Occupational History   Not on file  Tobacco Use   Smoking status: Never   Smokeless tobacco: Never  Vaping Use   Vaping Use: Never used  Substance and Sexual Activity   Alcohol use: No   Drug use: No   Sexual activity: Never  Other Topics Concern   Not on file  Social History Narrative   Not on file   Social Determinants of Health   Financial Resource Strain: Not on file  Food Insecurity: Not on file  Transportation Needs: Not on file  Physical Activity: Not on file  Stress: Not on file  Social Connections: Not on file  Intimate Partner Violence: Not on file    Physical Exam: Vital signs in last 24 hours: BP 138/83   Pulse (!) 57   Temp 99.1 F (37.3 C)   Ht 6' (1.829 m)   Wt 170 lb (77.1 kg)   SpO2 98%   BMI 23.06 kg/m  GEN: NAD EYE: Sclerae anicteric ENT: MMM CV: Non-tachycardic Pulm: No increased WOB GI: Soft NEURO:  Alert & Oriented   Eulah Pont, MD [facility]   05/14/2023 9:52 AM

## 2023-05-14 NOTE — Progress Notes (Signed)
Uneventful anesthetic. Report to pacu rn. Vss. Care resumed by rn. 

## 2023-05-14 NOTE — Progress Notes (Signed)
Called to room to assist during endoscopic procedure.  Patient ID and intended procedure confirmed with present staff. Received instructions for my participation in the procedure from the performing physician.  

## 2023-05-14 NOTE — Patient Instructions (Addendum)
Handout on gastritis given to patient  Await pathology results Follow up in office - appointment scheduled already with Dr. Leonides Schanz on 06/18/23 at 9:50 am (if this time/date doesn't end up working for you, please call the office to reschedule) Resume previous diet and continue present medications - Use Prilosec (Omeprazole) 40 mg twice a day for 8 weeks - pick up from [facility]   YOU HAD AN ENDOSCOPIC PROCEDURE TODAY AT THE [facility]:   Refer to the procedure report that was given to you for any specific questions about what was found during the examination.  If the procedure report does not answer your questions, please call your gastroenterologist to clarify.  If you requested that your care partner not be given the details of your procedure findings, then the procedure report has been included in a sealed envelope for you to review at your convenience later.  YOU SHOULD EXPECT: Some feelings of bloating in the abdomen. Passage of more gas than usual.  Walking can help get rid of the air that was put into your GI tract during the procedure and reduce the bloating. If you had a lower endoscopy (such as a colonoscopy or flexible sigmoidoscopy) you may notice spotting of blood in your stool or on the toilet paper. If you underwent a bowel prep for your procedure, you may not have a normal bowel movement for a few days.  Please Note:  You might notice some irritation and congestion in your nose or some drainage.  This is from the oxygen used during your procedure.  There is no need for concern and it should clear up in a day or so.  SYMPTOMS TO REPORT IMMEDIATELY:   Following upper endoscopy (EGD)  Vomiting of blood or coffee ground material  New chest pain or pain under the shoulder blades  Painful or persistently difficult swallowing  New shortness of breath  Fever of 100F or higher  Black, tarry-looking stools  For urgent or emergent issues, a gastroenterologist can  be reached at any hour by calling [phone]. Do not use MyChart messaging for urgent concerns.    DIET:  We do recommend a small meal at first, but then you may proceed to your regular diet.  Drink plenty of fluids but you should avoid alcoholic beverages for 24 hours.  ACTIVITY:  You should plan to take it easy for the rest of today and you should NOT DRIVE or use heavy machinery until tomorrow (because of the sedation medicines used during the test).    FOLLOW UP: Our staff will call the number listed on your records the next business day following your procedure.  We will call around 7:15- 8:00 am to check on you and address any questions or concerns that you may have regarding the information given to you following your procedure. If we do not reach you, we will leave a message.     If any biopsies were taken you will be contacted by phone or by letter within the next 1-3 weeks.  Please call us at [phone] if you have not heard about the biopsies in 3 weeks.    SIGNATURES/CONFIDENTIALITY: You and/or your care partner have signed paperwork which will be entered into your electronic medical record.  These signatures attest to the fact that that the information above on your After Visit Summary has been reviewed and is understood.  Full responsibility of the confidentiality of this discharge information lies with you and/or your care-partner.

## 2023-05-14 NOTE — Progress Notes (Signed)
Pt's states no medical or surgical changes since previsit or office visit. 

## 2023-05-15 ENCOUNTER — Telehealth: Payer: Self-pay | Admitting: *Deleted

## 2023-05-15 NOTE — Telephone Encounter (Signed)
Attempted f/u phone call. No answer. Left message. °

## 2023-05-16 ENCOUNTER — Encounter: Payer: Self-pay | Admitting: Internal Medicine

## 2023-05-18 ENCOUNTER — Other Ambulatory Visit: Payer: 59

## 2023-06-18 ENCOUNTER — Encounter: Payer: Self-pay | Admitting: Internal Medicine

## 2023-06-18 ENCOUNTER — Ambulatory Visit (INDEPENDENT_AMBULATORY_CARE_PROVIDER_SITE_OTHER): Payer: 59 | Admitting: Internal Medicine

## 2023-06-18 ENCOUNTER — Other Ambulatory Visit: Payer: 59

## 2023-06-18 DIAGNOSIS — K2970 Gastritis, unspecified, without bleeding: Secondary | ICD-10-CM | POA: Diagnosis not present

## 2023-06-18 DIAGNOSIS — K2990 Gastroduodenitis, unspecified, without bleeding: Secondary | ICD-10-CM

## 2023-06-18 DIAGNOSIS — R112 Nausea with vomiting, unspecified: Secondary | ICD-10-CM | POA: Diagnosis not present

## 2023-06-18 DIAGNOSIS — K2980 Duodenitis without bleeding: Secondary | ICD-10-CM

## 2023-06-18 MED ORDER — OMEPRAZOLE 40 MG PO CPDR
40.0000 mg | DELAYED_RELEASE_CAPSULE | Freq: Two times a day (BID) | ORAL | 2 refills | Status: DC
Start: 2023-06-18 — End: 2024-06-24

## 2023-06-18 NOTE — Progress Notes (Signed)
Chief Complaint: Abdominal pain, N&V  HPI : 22 year old male without significant medical history presents for follow up of abdominal pain and N&V  Interval History: He hasn't taken omeprazole for the last 2 weeks because he was out of the country in [place]. He ate bread while he was out of the country, which didn't seem to bother him. He has still been having epigastric ab pain and N&V, but this has been improving over time. He hasn't vomited in quite some time. He does still feel somewhat nauseous in the mornings. He has adjusted his diet slightly by not been eating past a certain hour. He is staying away from Delta-8 and other marijuana based products. He has one BM once every day or every other day. He only occasionally uses ibuprofen. Does not drink substantial amounts of alcohol. Does eat spicy foods. He did not try the Bentyl medication. Weight has been stable  Wt Readings from Last 3 Encounters:  06/18/23 173 lb (78.5 kg) (74 %, Z= 0.65)*  05/14/23 170 lb (77.1 kg) (71 %, Z= 0.56)*  05/08/23 173 lb 9.6 oz (78.7 kg) (75 %, Z= 0.68)*   * Growth percentiles are based on CDC (Boys, 2-20 Years) data.    Current Outpatient Medications  Medication Sig Dispense Refill   Multiple Vitamin (MULTIVITAMIN PO) Take by mouth.     nitroGLYCERIN (NITRODUR - DOSED IN MG/24 HR) 0.2 mg/hr patch Apply 1/4 patch daily to tendon for tendonitis. 30 patch 1   omeprazole (PRILOSEC) 40 MG capsule Take 1 capsule (40 mg total) by mouth 2 (two) times daily. 90 capsule 1   No current facility-administered medications for this visit.   Physical Exam: BP 118/80   Pulse 69   Ht 6\' 2"  (1.88 m)   Wt 173 lb (78.5 kg)   BMI 22.21 kg/m  Constitutional: Pleasant,well-developed, male in no acute distress. HEENT: Normocephalic and atraumatic. Conjunctivae are normal. No scleral icterus. Cardiovascular: Normal rate, regular rhythm.  Pulmonary/chest: Effort normal and breath sounds normal. No wheezing, rales or  rhonchi. Abdominal: Soft, nondistended, mildly tender to palpation in the epigastric area and in the lower abdomen. Bowel sounds active throughout. There are no masses palpable. No hepatomegaly. Extremities: No edema Neurological: Alert and oriented to person place and time. Skin: Skin is warm and dry. No rashes noted. Psychiatric: Normal mood and affect. Behavior is normal.  Labs 09/2021: CBC and CMP unremarkable. Lipase mildly elevated at 81. TSH nml. TTG IgA negative.  Cortisol nml.   CT A/P 03/31/12: IMPRESSION:  Negative for appendicitis.  Findings consistent with mesenteric  adenitis.   Upper GI series 06/24/21: IMPRESSION: Normal upper GI.  No explanation for patient's symptoms.  Ab U/S 06/24/21: IMPRESSION: Normal abdominal ultrasound for age.  Ab U/S 07/06/22: IMPRESSION: 1. No abnormalities are identified to explain the patient's nausea and vomiting. Normal study within visualized limits.  EGD 05/14/23: Impression:               - Normal esophagus. Biopsied.                           - Clear gastric fluid.                           - Gastritis. Biopsied.                           -  Duodenitis. Biopsied. Path: 1. Surgical [P], duodenal - DUODENAL MUCOSA WITH NO SIGNIFICANT PATHOLOGY. 2. Surgical [P], gastric - ANTRAL MUCOSA WITH CHEMICAL/REACTIVE GASTROPATHY. - OXYNTIC MUCOSA WITH NO SIGNIFICANT PATHOLOGY. - NO HELICOBACTER PYLORI ORGANISMS IDENTIFIED ON H&E STAINED SLIDE. 3. Surgical [P], esophagus - SQUAMOUS MUCOSA WITH NO SIGNIFICANT PATHOLOGY.  ASSESSMENT AND PLAN: Periumbilical abdominal pain N&V Patient has had improvement in his N&V and ab pain. He does think that eating several hours before bedtime seems to help with some of his symptoms. His recent EGD did not show any signs of celiac disease. He does have some gastritis, which may be due to spicy foods and occasional ibuprofen use and alcohol use. He has been continuing to avoid Delta-8. Will have him take  PPI BID for another 4 weeks to help with healing of gastritis, and then have him drop down to every day. 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 - Continue to avoid Delta 8 use - Will follow up CT A/P w/contrast that he has scheduled for later this month - Take omeprazole BID for 4 weeks, then decrease to every day - Discussed following a combination of a GERD friendly and gastroparesis diet. Avoid eating large meals, spicy foods, and fatty foods. - RTC in 3-4 months   Eulah Pont, MD  I spent 38 minutes of time, including in depth chart review, independent review of results as outlined above, communicating results with the patient directly, face-to-face time with the patient, coordinating care, ordering studies and medications as appropriate, and documentation.

## 2023-06-18 NOTE — Patient Instructions (Addendum)
We have sent the following medications to your pharmacy for you to pick up at your convenience: Omeprazole  Take Omeprazole 40 mg 2 times daily for 4 weeks then decrease to 1 times daily  You are scheduled for a follow up visit on 08/21/23 at 11:10 am  _______________________________________________________  If your blood pressure at your visit was 140/90 or greater, please contact your primary care physician to follow up on this.  _______________________________________________________  If you are age 10 or older, your body mass index should be between 23-30. Your Body mass index is 22.21 kg/m. If this is out of the aforementioned range listed, please consider follow up with your Primary Care Provider.  If you are age 25 or younger, your body mass index should be between 19-25. Your Body mass index is 22.21 kg/m. If this is out of the aformentioned range listed, please consider follow up with your Primary Care Provider.   ________________________________________________________  The [facility] providers would like to encourage you to use [facility] to communicate with providers for non-urgent requests or questions.  Due to long hold times on the telephone, sending your provider a message by [facility] may be a faster and more efficient way to get a response.  Please allow 48 business hours for a response.  Please remember that this is for non-urgent requests.  _______________________________________________________   Thank you for entrusting me with your care and for choosing [facility], Dr. Eulah Pont

## 2023-06-19 ENCOUNTER — Other Ambulatory Visit: Payer: 59

## 2023-06-21 DIAGNOSIS — F411 Generalized anxiety disorder: Secondary | ICD-10-CM | POA: Diagnosis not present

## 2023-07-10 ENCOUNTER — Other Ambulatory Visit: Payer: 59

## 2023-08-21 ENCOUNTER — Ambulatory Visit: Payer: 59 | Admitting: Internal Medicine

## 2023-09-14 DIAGNOSIS — J069 Acute upper respiratory infection, unspecified: Secondary | ICD-10-CM | POA: Diagnosis not present

## 2023-10-31 ENCOUNTER — Other Ambulatory Visit: Payer: Self-pay

## 2023-10-31 ENCOUNTER — Other Ambulatory Visit (HOSPITAL_BASED_OUTPATIENT_CLINIC_OR_DEPARTMENT_OTHER): Payer: Self-pay

## 2023-10-31 ENCOUNTER — Encounter (HOSPITAL_BASED_OUTPATIENT_CLINIC_OR_DEPARTMENT_OTHER): Payer: Self-pay | Admitting: *Deleted

## 2023-10-31 ENCOUNTER — Emergency Department (HOSPITAL_BASED_OUTPATIENT_CLINIC_OR_DEPARTMENT_OTHER): Payer: 59

## 2023-10-31 ENCOUNTER — Emergency Department (HOSPITAL_BASED_OUTPATIENT_CLINIC_OR_DEPARTMENT_OTHER)
Admission: EM | Admit: 2023-10-31 | Discharge: 2023-10-31 | Disposition: A | Discharge status: Home / Self Care | Payer: 59 | Attending: Emergency Medicine | Admitting: Emergency Medicine

## 2023-10-31 DIAGNOSIS — R197 Diarrhea, unspecified: Secondary | ICD-10-CM | POA: Insufficient documentation

## 2023-10-31 DIAGNOSIS — D72829 Elevated white blood cell count, unspecified: Secondary | ICD-10-CM | POA: Insufficient documentation

## 2023-10-31 DIAGNOSIS — Z1152 Encounter for screening for COVID-19: Secondary | ICD-10-CM | POA: Diagnosis not present

## 2023-10-31 DIAGNOSIS — R1030 Lower abdominal pain, unspecified: Secondary | ICD-10-CM | POA: Insufficient documentation

## 2023-10-31 DIAGNOSIS — R590 Localized enlarged lymph nodes: Secondary | ICD-10-CM | POA: Diagnosis not present

## 2023-10-31 DIAGNOSIS — R112 Nausea with vomiting, unspecified: Secondary | ICD-10-CM | POA: Insufficient documentation

## 2023-10-31 DIAGNOSIS — R109 Unspecified abdominal pain: Secondary | ICD-10-CM | POA: Diagnosis not present

## 2023-10-31 DIAGNOSIS — R188 Other ascites: Secondary | ICD-10-CM | POA: Diagnosis not present

## 2023-10-31 LAB — URINALYSIS, ROUTINE W REFLEX MICROSCOPIC
Bilirubin Urine: NEGATIVE
Glucose, UA: NEGATIVE mg/dL
Hgb urine dipstick: NEGATIVE
Ketones, ur: >80 mg/dL — AB
Leukocytes,Ua: NEGATIVE
Nitrite: NEGATIVE
Specific Gravity, Urine: >1.046 — ABNORMAL HIGH (ref 1.005–1.030)
pH: 6 (ref 5.0–8.0)

## 2023-10-31 LAB — CBC
HCT: 42.7 % (ref 39.0–52.0)
Hemoglobin: 15.3 g/dL (ref 13.0–17.0)
MCH: 30.3 pg (ref 26.0–34.0)
MCHC: 35.8 g/dL (ref 30.0–36.0)
MCV: 84.6 fL (ref 80.0–100.0)
Platelets: 190 10*3/uL (ref 150–400)
RBC: 5.05 MIL/uL (ref 4.22–5.81)
RDW: 12.8 % (ref 11.5–15.5)
WBC: 11.7 10*3/uL — ABNORMAL HIGH (ref 4.0–10.5)
nRBC: 0 % (ref 0.0–0.2)

## 2023-10-31 LAB — COMPREHENSIVE METABOLIC PANEL
ALT: 38 U/L (ref 0–44)
AST: 19 U/L (ref 15–41)
Albumin: 4.8 g/dL (ref 3.5–5.0)
Alkaline Phosphatase: 78 U/L (ref 38–126)
Anion gap: 11 (ref 5–15)
BUN: 19 mg/dL (ref 6–20)
CO2: 23 mmol/L (ref 22–32)
Calcium: 9.2 mg/dL (ref 8.9–10.3)
Chloride: 99 mmol/L (ref 98–111)
Creatinine, Ser: 0.98 mg/dL (ref 0.61–1.24)
GFR, Estimated: >60 mL/min (ref >60)
Glucose, Bld: 97 mg/dL (ref 70–99)
Potassium: 3.6 mmol/L (ref 3.5–5.1)
Sodium: 133 mmol/L — ABNORMAL LOW (ref 135–145)
Total Bilirubin: 0.8 mg/dL (ref <1.2)
Total Protein: 7.9 g/dL (ref 6.5–8.1)

## 2023-10-31 LAB — RESP PANEL BY RT-PCR (RSV, FLU A&B, COVID)  RVPGX2
Influenza A by PCR: NEGATIVE
Influenza B by PCR: NEGATIVE
Resp Syncytial Virus by PCR: NEGATIVE
SARS Coronavirus 2 by RT PCR: NEGATIVE

## 2023-10-31 LAB — LIPASE, BLOOD: Lipase: 17 U/L (ref 11–51)

## 2023-10-31 MED ORDER — LACTATED RINGERS IV BOLUS
1000.0000 mL | Freq: Once | INTRAVENOUS | Status: AC
Start: 2023-10-31 — End: 2023-10-31
  Administered 2023-10-31: 1000 mL via INTRAVENOUS

## 2023-10-31 MED ORDER — LACTATED RINGERS IV BOLUS
1000.0000 mL | Freq: Once | INTRAVENOUS | Status: AC
  Administered 2023-10-31: 1000 mL via INTRAVENOUS

## 2023-10-31 MED ORDER — ONDANSETRON 8 MG PO TBDP
8.0000 mg | ORAL_TABLET | Freq: Three times a day (TID) | ORAL | 0 refills | Status: AC | PRN
  Filled 2023-10-31: qty 18, 21d supply, fill #0

## 2023-10-31 MED ORDER — ONDANSETRON HCL 4 MG/2ML IJ SOLN
4.0000 mg | Freq: Once | INTRAMUSCULAR | Status: AC
  Administered 2023-10-31: 4 mg via INTRAVENOUS
  Filled 2023-10-31: qty 2

## 2023-10-31 MED ORDER — IOHEXOL 300 MG/ML  SOLN
100.0000 mL | Freq: Once | INTRAMUSCULAR | Status: AC | PRN
  Administered 2023-10-31: 85 mL via INTRAVENOUS

## 2023-10-31 NOTE — ED Notes (Signed)
Additional warm blanket given.  Pt is being taken to CT at this time

## 2023-10-31 NOTE — Discharge Instructions (Addendum)
Please use Zofran as needed for nausea.  Please drink oral rehydration fluids such as Gatorade and slowly advance your diet. Call your gastroenterologist for follow-up.  Recheck if not improving in 2 to 3 days and return if you are getting worse at any time.

## 2023-10-31 NOTE — ED Triage Notes (Signed)
Pt has had nausea, vomiting and diarrhea along with fever since 3am yesterday.  Pt reports that he feels dehydrated and can not keep anything down.

## 2023-10-31 NOTE — ED Notes (Signed)
Pt voided x2 since having IV fluids, taking sips of ginger ale.  Encouraged PO intake

## 2023-10-31 NOTE — ED Provider Notes (Signed)
[place] EMERGENCY DEPARTMENT AT [facility] Provider Note   CSN: [id number] Arrival date & time: 10/31/23  4782     History  Chief Complaint  Patient presents with   Emesis    Kenneth Wilkinson is a 22 y.o. male.  HPI 22 year old previously healthy male presents today complaining of nausea vomiting and diarrhea since 4 AM yesterday.  He has had a fever to 101.  He has vomited multiple times all night.  He is complaining of abdominal pain and decreased urination. Nausea, vomiting, diarrhea x 28 hours.  Fever to 101 Abdominal pain, decreased urination     Home Medications Prior to Admission medications   Medication Sig Start Date End Date Taking? Authorizing Provider  ondansetron (ZOFRAN-ODT) 8 MG disintegrating tablet Take 1 tablet (8 mg total) by mouth every 8 (eight) hours as needed for nausea or vomiting. 10/31/23  Yes Margarita Grizzle, MD  Multiple Vitamin (MULTIVITAMIN PO) Take by mouth.    [provider]  nitroGLYCERIN (NITRODUR - DOSED IN MG/24 HR) 0.2 mg/hr patch Apply 1/4 patch daily to tendon for tendonitis. 05/08/23   Rodolph Bong, MD  omeprazole (PRILOSEC) 40 MG capsule Take 1 capsule (40 mg total) by mouth 2 (two) times daily. 06/18/23   Imogene Burn, MD      Allergies    Penicillins    Review of Systems   Review of Systems  Physical Exam Updated Vital Signs BP (!) 144/59   Pulse 93   Temp 99 F (37.2 C) (Oral)   Resp 14   Wt 79.4 kg   SpO2 98%   BMI 22.47 kg/m  Physical Exam Vitals and nursing note reviewed.  Constitutional:      Appearance: Normal appearance. He is well-developed.  HENT:     Head: Normocephalic and atraumatic.     Right Ear: External ear normal.     Left Ear: External ear normal.     Nose: Nose normal.     Mouth/Throat:     Mouth: Mucous membranes are dry.  Eyes:     Extraocular Movements: Extraocular movements intact.  Neck:     Trachea: No tracheal deviation.  Cardiovascular:     Rate and Rhythm:  Normal rate and regular rhythm.  Pulmonary:     Effort: Pulmonary effort is normal.  Abdominal:     General: Abdomen is flat.     Palpations: Abdomen is soft.     Tenderness: There is abdominal tenderness.  Musculoskeletal:        General: Normal range of motion.     Cervical back: Normal range of motion.  Skin:    General: Skin is warm and dry.  Neurological:     Mental Status: He is alert and oriented to person, place, and time.  Psychiatric:        Mood and Affect: Mood normal.        Behavior: Behavior normal.     ED Results / Procedures / Treatments   Labs (all labs ordered are listed, but only abnormal results are displayed) Labs Reviewed  COMPREHENSIVE METABOLIC PANEL - Abnormal; Notable for the following components:      Result Value   Sodium 133 (*)    All other components within normal limits  CBC - Abnormal; Notable for the following components:   WBC 11.7 (*)    All other components within normal limits  URINALYSIS, ROUTINE W REFLEX MICROSCOPIC - Abnormal; Notable for the following components:   Specific Gravity,  Urine >1.046 (*)    Ketones, ur >80 (*)    Protein, ur TRACE (*)    All other components within normal limits  RESP PANEL BY RT-PCR (RSV, FLU A&B, COVID)  RVPGX2  LIPASE, BLOOD    EKG None  Radiology CT ABDOMEN PELVIS W CONTRAST  Result Date: 10/31/2023 CLINICAL DATA:  Abdominal pain, acute, nonlocalized. EXAM: CT ABDOMEN AND PELVIS WITH CONTRAST TECHNIQUE: Multidetector CT imaging of the abdomen and pelvis was performed using the standard protocol following bolus administration of intravenous contrast. RADIATION DOSE REDUCTION: This exam was performed according to the departmental dose-optimization program which includes automated exposure control, adjustment of the mA and/or kV according to patient size and/or use of iterative reconstruction technique. CONTRAST:  85mL OMNIPAQUE IOHEXOL 300 MG/ML  SOLN COMPARISON:  CT scan abdomen and pelvis from  03/31/2012. FINDINGS: Lower chest: The lung bases are clear. No pleural effusion. The heart is normal in size. No pericardial effusion. Hepatobiliary: The liver is normal in size. Non-cirrhotic configuration. No suspicious mass. There is a sub 5 mm hypoattenuating focus in the right hepatic dome, which is too small to adequately characterize. No intrahepatic or extrahepatic bile duct dilation. No calcified gallstones. Normal gallbladder wall thickness. No pericholecystic inflammatory changes. Pancreas: Unremarkable. No pancreatic ductal dilatation or surrounding inflammatory changes. Spleen: Within normal limits. No focal lesion. Adrenals/Urinary Tract: Adrenal glands are unremarkable. No suspicious renal mass. No hydronephrosis. No renal or ureteric calculi. Unremarkable urinary bladder. Stomach/Bowel: No disproportionate dilation of the small or large bowel loops. The appendix is unremarkable. There is mild-to-moderate circumferential thickening of the ascending colon and hepatic flexure of colon with mild mucosal hyperattenuation. There is no significant pericolonic fat stranding; however, there is prominence of vasa recta and multiple right lower quadrant mildly enlarged mesenteric lymph nodes. In appropriate clinical setting, findings favor colitis, most likely infective or inflammatory in etiology. Vascular/Lymphatic: No pneumoperitoneum. There is trace amount of ascites in the dependent pelvis, likely reactive colitis. No walled-off abscess or loculated collection. No aneurysmal dilation of the major abdominal arteries. Reproductive: Normal size prostate. Symmetric seminal vesicles. Other: The visualized soft tissues and abdominal wall are unremarkable. Musculoskeletal: No suspicious osseous lesions. IMPRESSION: *Findings described above, in appropriate clinical settings favor uncomplicated right hemi colitis. *Multiple other nonacute observations, as described above. Electronically Signed   By: Jules Schick M.D.   On: 10/31/2023 14:10    Procedures Procedures    Medications Ordered in ED Medications  lactated ringers bolus 1,000 mL (0 mLs Intravenous Stopped 10/31/23 1055)  ondansetron (ZOFRAN) injection 4 mg (4 mg Intravenous Given 10/31/23 0958)  iohexol (OMNIPAQUE) 300 MG/ML solution 100 mL (85 mLs Intravenous Contrast Given 10/31/23 1052)  lactated ringers bolus 1,000 mL (0 mLs Intravenous Stopped 10/31/23 1339)    ED Course/ Medical Decision Making/ A&P Clinical Course as of 10/31/23 1535  Wed Oct 31, 2023  1035 Cmet sodium slightly decreased at 133 Cbc mild leukocytosis Lipase normal [DR]  1259 No vomiting here He has taken some po  [DR]    Clinical Course User Index [DR] Margarita Grizzle, MD                                 Medical Decision Making Amount and/or Complexity of Data Reviewed Labs: ordered. Radiology: ordered.  Risk Prescription drug management.  22 year old male with nausea vomiting and abdominal pain.  Patient received IV fluids and antiemetics here.  He has  had improvement of his symptoms.  He has tolerated fluids and has not vomited since being here. Differential diagnosis includes but is not limited to viral gastroenteritis, colitis, intra-abdominal abnormalities including appendicitis with right lower quadrant pain, UTI. Patient has essentially normal labs. CT is pending to evaluate for acute intra-abdominal etiology.  If this is negative, specifically if not positive for appendicitis, patient appears to be stable for discharge home with Zofran Discussed follow-up and return precautions with the patient and family and he voices understanding.        Final Clinical Impression(s) / ED Diagnoses Final diagnoses:  Nausea and vomiting, unspecified vomiting type  Lower abdominal pain    Rx / DC Orders ED Discharge Orders          Ordered    ondansetron (ZOFRAN-ODT) 8 MG disintegrating tablet  Every 8 hours PRN        10/31/23 1510               Margarita Grizzle, MD 10/31/23 1535

## 2023-10-31 NOTE — ED Notes (Signed)
Pt went to bathroom and had one episode of diarrhea

## 2023-10-31 NOTE — ED Notes (Signed)
Pt was given ginger ale and Gatorlyte, po intake encouraged

## 2023-10-31 NOTE — ED Notes (Signed)
 RN reviewed discharge instructions with pt. Pt verbalized understanding and had no further questions. VSS upon discharge.  

## 2023-11-01 ENCOUNTER — Telehealth (HOSPITAL_COMMUNITY): Payer: Self-pay

## 2023-11-27 ENCOUNTER — Other Ambulatory Visit (INDEPENDENT_AMBULATORY_CARE_PROVIDER_SITE_OTHER): Payer: 59

## 2023-11-27 ENCOUNTER — Ambulatory Visit: Payer: 59 | Admitting: Internal Medicine

## 2023-11-27 ENCOUNTER — Encounter: Payer: Self-pay | Admitting: Internal Medicine

## 2023-11-27 VITALS — BP 120/74 | HR 78 | Ht 74.0 in | Wt 200.0 lb

## 2023-11-27 DIAGNOSIS — K219 Gastro-esophageal reflux disease without esophagitis: Secondary | ICD-10-CM

## 2023-11-27 DIAGNOSIS — R5383 Other fatigue: Secondary | ICD-10-CM

## 2023-11-27 DIAGNOSIS — R112 Nausea with vomiting, unspecified: Secondary | ICD-10-CM | POA: Diagnosis not present

## 2023-11-27 DIAGNOSIS — R1033 Periumbilical pain: Secondary | ICD-10-CM

## 2023-11-27 LAB — VITAMIN B12: Vitamin B-12: 653 pg/mL (ref 211–911)

## 2023-11-27 LAB — IBC + FERRITIN
Ferritin: 94.4 ng/mL (ref 22.0–322.0)
Iron: 97 ug/dL (ref 42–165)
Saturation Ratios: 27.4 % (ref 20.0–50.0)
TIBC: 354.2 ug/dL (ref 250.0–450.0)
Transferrin: 253 mg/dL (ref 212.0–360.0)

## 2023-11-27 LAB — VITAMIN D 25 HYDROXY (VIT D DEFICIENCY, FRACTURES): VITD: 17.09 ng/mL — ABNORMAL LOW (ref 30.00–100.00)

## 2023-11-27 NOTE — Patient Instructions (Signed)
Your provider has requested that you go to the basement level for lab work before leaving today. Press "B" on the elevator. The lab is located at the first door on the left as you exit the elevator.  Follow up in 6 months  If your blood pressure at your visit was 140/90 or greater, please contact your primary care physician to follow up on this.  _______________________________________________________  If you are age 20 or older, your body mass index should be between 23-30. Your Body mass index is 25.68 kg/m. If this is out of the aforementioned range listed, please consider follow up with your Primary Care Provider.  If you are age 13 or younger, your body mass index should be between 19-25. Your Body mass index is 25.68 kg/m. If this is out of the aformentioned range listed, please consider follow up with your Primary Care Provider.   ________________________________________________________  The [facility] providers would like to encourage you to use [facility] to communicate with providers for non-urgent requests or questions.  Due to long hold times on the telephone, sending your provider a message by [facility] may be a faster and more efficient way to get a response.  Please allow 48 business hours for a response.  Please remember that this is for non-urgent requests.  _______________________________________________________   Due to recent changes in healthcare laws, you may see the results of your imaging and laboratory studies on MyChart before your provider has had a chance to review them.  We understand that in some cases there may be results that are confusing or concerning to you. Not all laboratory results come back in the same time frame and the provider may be waiting for multiple results in order to interpret others.  Please give [place] 48 hours in order for your provider to thoroughly review all the results before contacting the office for clarification of your results.   Thank you for  entrusting me with your care and for choosing [facility], Dr. Eulah Pont
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
## 2023-12-19 ENCOUNTER — Encounter: Payer: Self-pay | Admitting: Internal Medicine

## 2023-12-23 DIAGNOSIS — Z20822 Contact with and (suspected) exposure to covid-19: Secondary | ICD-10-CM | POA: Diagnosis not present

## 2023-12-23 DIAGNOSIS — J101 Influenza due to other identified influenza virus with other respiratory manifestations: Secondary | ICD-10-CM | POA: Diagnosis not present

## 2023-12-23 DIAGNOSIS — R509 Fever, unspecified: Secondary | ICD-10-CM | POA: Diagnosis not present

## 2023-12-23 DIAGNOSIS — R112 Nausea with vomiting, unspecified: Secondary | ICD-10-CM | POA: Diagnosis not present

## 2023-12-24 DIAGNOSIS — L239 Allergic contact dermatitis, unspecified cause: Secondary | ICD-10-CM | POA: Diagnosis not present

## 2024-02-26 ENCOUNTER — Encounter: Payer: Self-pay | Admitting: Internal Medicine

## 2024-04-21 DIAGNOSIS — Z0000 Encounter for general adult medical examination without abnormal findings: Secondary | ICD-10-CM | POA: Diagnosis not present

## 2024-04-23 DIAGNOSIS — K130 Diseases of lips: Secondary | ICD-10-CM | POA: Diagnosis not present

## 2024-04-23 DIAGNOSIS — Z0000 Encounter for general adult medical examination without abnormal findings: Secondary | ICD-10-CM | POA: Diagnosis not present

## 2024-04-23 DIAGNOSIS — F411 Generalized anxiety disorder: Secondary | ICD-10-CM | POA: Diagnosis not present

## 2024-04-28 DIAGNOSIS — F411 Generalized anxiety disorder: Secondary | ICD-10-CM | POA: Diagnosis not present

## 2024-05-07 DIAGNOSIS — F411 Generalized anxiety disorder: Secondary | ICD-10-CM | POA: Diagnosis not present

## 2024-05-14 DIAGNOSIS — F411 Generalized anxiety disorder: Secondary | ICD-10-CM | POA: Diagnosis not present

## 2024-05-28 ENCOUNTER — Ambulatory Visit: Admitting: Internal Medicine

## 2024-05-28 DIAGNOSIS — F411 Generalized anxiety disorder: Secondary | ICD-10-CM | POA: Diagnosis not present

## 2024-05-30 DIAGNOSIS — F411 Generalized anxiety disorder: Secondary | ICD-10-CM | POA: Diagnosis not present

## 2024-05-30 DIAGNOSIS — M25551 Pain in right hip: Secondary | ICD-10-CM | POA: Diagnosis not present

## 2024-06-20 DIAGNOSIS — L0390 Cellulitis, unspecified: Secondary | ICD-10-CM | POA: Diagnosis not present

## 2024-06-23 ENCOUNTER — Ambulatory Visit: Admitting: Family Medicine

## 2024-06-24 ENCOUNTER — Ambulatory Visit: Admitting: Family Medicine

## 2024-06-24 NOTE — Progress Notes (Deleted)
   LILLETTE Ileana Collet, PhD, LAT, ATC acting as a scribe for Artist Lloyd, MD.  Kenneth Wilkinson is a 23 y.o. male who presents to [facility] today for R hip pain. Pt was previously seen by Dr. Lloyd on 05/08/23 for bilat knee pain.   Today, pt c/o R hip pain x ***. Pt locates pain to ***  Radiates: Aggravates: Treatments tried:  Pertinent review of systems: ***  Relevant historical information: ***   Exam:  There were no vitals taken for this visit. General: Well Developed, well nourished, and in no acute distress.   MSK: ***    Lab and Radiology Results No results found for this or any previous visit (from the past 72 hours). No results found.     Assessment and Plan: 23 y.o. male with ***   PDMP not reviewed this encounter. No orders of the defined types were placed in this encounter.  No orders of the defined types were placed in this encounter.    Discussed warning signs or symptoms. Please see discharge instructions. Patient expresses understanding.   ***

## 2024-06-25 ENCOUNTER — Ambulatory Visit: Admitting: Internal Medicine

## 2024-07-02 DIAGNOSIS — F411 Generalized anxiety disorder: Secondary | ICD-10-CM | POA: Diagnosis not present

## 2024-07-21 ENCOUNTER — Ambulatory Visit: Admitting: Gastroenterology

## 2024-07-21 NOTE — Progress Notes (Deleted)
 SABRA

## 2024-08-08 DIAGNOSIS — E559 Vitamin D deficiency, unspecified: Secondary | ICD-10-CM | POA: Diagnosis not present

## 2024-08-08 DIAGNOSIS — M898X1 Other specified disorders of bone, shoulder: Secondary | ICD-10-CM | POA: Diagnosis not present

## 2024-08-08 DIAGNOSIS — R1013 Epigastric pain: Secondary | ICD-10-CM | POA: Diagnosis not present

## 2024-08-08 DIAGNOSIS — R197 Diarrhea, unspecified: Secondary | ICD-10-CM | POA: Diagnosis not present

## 2024-08-21 ENCOUNTER — Ambulatory Visit: Payer: Self-pay

## 2024-08-21 DIAGNOSIS — R110 Nausea: Secondary | ICD-10-CM | POA: Diagnosis not present

## 2024-08-21 DIAGNOSIS — J449 Chronic obstructive pulmonary disease, unspecified: Secondary | ICD-10-CM | POA: Diagnosis not present

## 2024-08-21 DIAGNOSIS — R1030 Lower abdominal pain, unspecified: Secondary | ICD-10-CM | POA: Diagnosis not present

## 2024-08-21 DIAGNOSIS — Z5321 Procedure and treatment not carried out due to patient leaving prior to being seen by health care provider: Secondary | ICD-10-CM | POA: Diagnosis not present

## 2024-08-21 DIAGNOSIS — D72829 Elevated white blood cell count, unspecified: Secondary | ICD-10-CM | POA: Diagnosis not present

## 2024-08-21 NOTE — Progress Notes (Deleted)
    Ben Jackson D.[facility] [address] [place] [address] Phone: [phone]   Assessment and Plan:     ***    Pertinent previous records reviewed include ***   Follow Up: ***     Subjective:   I, Lajoy Vanamburg, am serving as a Neurosurgeon for Doctor Morene Mace  Chief Complaint: left scap and bilat hip pain   HPI:   08/22/2024 Patient is a 23 year old male with left scap and bilat hip pain. Patient states    Relevant Historical Information: ***  Additional pertinent review of systems negative.   Current Outpatient Medications:    buPROPion (WELLBUTRIN XL) 150 MG 24 hr tablet, Take 150 mg by mouth daily., Disp: , Rfl:    ondansetron  (ZOFRAN -ODT) 8 MG disintegrating tablet, Take 1 tablet (8 mg total) by mouth every 8 (eight) hours as needed for nausea or vomiting., Disp: 20 tablet, Rfl: 0   Objective:     There were no vitals filed for this visit.    There is no height or weight on file to calculate BMI.    Physical Exam:    ***   Electronically signed by:  Odis Mace D.[facility] 7:49 AM 08/21/24

## 2024-08-22 ENCOUNTER — Ambulatory Visit: Admitting: Sports Medicine

## 2024-09-01 ENCOUNTER — Encounter: Payer: Self-pay | Admitting: Sports Medicine

## 2024-09-10 ENCOUNTER — Encounter (INDEPENDENT_AMBULATORY_CARE_PROVIDER_SITE_OTHER): Payer: Self-pay

## 2024-09-11 ENCOUNTER — Encounter (INDEPENDENT_AMBULATORY_CARE_PROVIDER_SITE_OTHER): Payer: Self-pay

## 2024-09-29 DIAGNOSIS — F411 Generalized anxiety disorder: Secondary | ICD-10-CM | POA: Diagnosis not present

## 2024-10-06 DIAGNOSIS — F411 Generalized anxiety disorder: Secondary | ICD-10-CM | POA: Diagnosis not present

## 2024-10-16 DIAGNOSIS — F411 Generalized anxiety disorder: Secondary | ICD-10-CM | POA: Diagnosis not present

## 2024-10-20 DIAGNOSIS — F411 Generalized anxiety disorder: Secondary | ICD-10-CM | POA: Diagnosis not present

## 2024-10-29 DIAGNOSIS — F411 Generalized anxiety disorder: Secondary | ICD-10-CM | POA: Diagnosis not present

## 2024-11-12 DIAGNOSIS — F411 Generalized anxiety disorder: Secondary | ICD-10-CM | POA: Diagnosis not present

## 2024-11-26 DIAGNOSIS — F411 Generalized anxiety disorder: Secondary | ICD-10-CM | POA: Diagnosis not present

## 2025-02-11 ENCOUNTER — Ambulatory Visit: Admitting: Internal Medicine
